# Patient Record
Sex: Female | Born: 1970 | Race: Asian | Hispanic: No | Marital: Single | Smoking: Never smoker
Health system: Southern US, Community
[De-identification: ages and names within clinical notes are randomized; demographics above are authoritative.]

## PROBLEM LIST (undated history)

## (undated) DIAGNOSIS — N368 Other specified disorders of urethra: Secondary | ICD-10-CM

## (undated) HISTORY — PX: TUBAL LIGATION: SHX77

---

## 2009-06-15 ENCOUNTER — Emergency Department (HOSPITAL_COMMUNITY): Admission: EM | Admit: 2009-06-15 | Discharge: 2009-06-15 | Payer: Self-pay | Admitting: Family Medicine

## 2010-05-06 ENCOUNTER — Inpatient Hospital Stay (INDEPENDENT_AMBULATORY_CARE_PROVIDER_SITE_OTHER)
Admission: RE | Admit: 2010-05-06 | Discharge: 2010-05-06 | Disposition: A | Discharge status: Home / Self Care | Payer: BC Managed Care – PPO | Source: Ambulatory Visit | Attending: Emergency Medicine | Admitting: Emergency Medicine

## 2010-05-06 ENCOUNTER — Ambulatory Visit (INDEPENDENT_AMBULATORY_CARE_PROVIDER_SITE_OTHER): Payer: BC Managed Care – PPO

## 2010-05-06 DIAGNOSIS — R05 Cough: Secondary | ICD-10-CM

## 2010-05-06 DIAGNOSIS — R059 Cough, unspecified: Secondary | ICD-10-CM

## 2010-12-08 ENCOUNTER — Inpatient Hospital Stay (INDEPENDENT_AMBULATORY_CARE_PROVIDER_SITE_OTHER)
Admission: RE | Admit: 2010-12-08 | Discharge: 2010-12-08 | Disposition: A | Discharge status: Home / Self Care | Payer: BC Managed Care – PPO | Source: Ambulatory Visit | Attending: Family Medicine | Admitting: Family Medicine

## 2010-12-08 DIAGNOSIS — S335XXA Sprain of ligaments of lumbar spine, initial encounter: Secondary | ICD-10-CM

## 2010-12-10 ENCOUNTER — Emergency Department (HOSPITAL_COMMUNITY)
Admission: EM | Admit: 2010-12-10 | Discharge: 2010-12-10 | Disposition: A | Discharge status: Home / Self Care | Payer: BC Managed Care – PPO | Attending: Emergency Medicine | Admitting: Emergency Medicine

## 2010-12-10 DIAGNOSIS — M545 Low back pain, unspecified: Secondary | ICD-10-CM | POA: Insufficient documentation

## 2010-12-10 DIAGNOSIS — M549 Dorsalgia, unspecified: Secondary | ICD-10-CM | POA: Insufficient documentation

## 2010-12-13 ENCOUNTER — Other Ambulatory Visit: Payer: Self-pay | Admitting: Family Medicine

## 2010-12-13 DIAGNOSIS — M545 Low back pain: Secondary | ICD-10-CM

## 2010-12-14 ENCOUNTER — Ambulatory Visit
Admission: RE | Admit: 2010-12-14 | Discharge: 2010-12-14 | Disposition: A | Discharge status: Home / Self Care | Payer: BC Managed Care – PPO | Source: Ambulatory Visit | Attending: Family Medicine | Admitting: Family Medicine

## 2010-12-14 DIAGNOSIS — M545 Low back pain: Secondary | ICD-10-CM

## 2012-06-09 ENCOUNTER — Other Ambulatory Visit: Payer: Self-pay | Admitting: Obstetrics and Gynecology

## 2012-06-09 DIAGNOSIS — Z1231 Encounter for screening mammogram for malignant neoplasm of breast: Secondary | ICD-10-CM

## 2012-06-17 ENCOUNTER — Ambulatory Visit: Payer: BC Managed Care – PPO

## 2013-02-13 ENCOUNTER — Ambulatory Visit: Payer: BC Managed Care – PPO

## 2013-02-13 ENCOUNTER — Ambulatory Visit (INDEPENDENT_AMBULATORY_CARE_PROVIDER_SITE_OTHER): Payer: BC Managed Care – PPO | Admitting: Emergency Medicine

## 2013-02-13 VITALS — BP 120/74 | HR 97 | Temp 100.4°F | Resp 18 | Ht 61.0 in | Wt 171.0 lb

## 2013-02-13 DIAGNOSIS — R05 Cough: Secondary | ICD-10-CM

## 2013-02-13 DIAGNOSIS — R059 Cough, unspecified: Secondary | ICD-10-CM

## 2013-02-13 DIAGNOSIS — J209 Acute bronchitis, unspecified: Secondary | ICD-10-CM

## 2013-02-13 MED ORDER — LEVOFLOXACIN 500 MG PO TABS: 500.0000 mg | ORAL_TABLET | Freq: Every day | ORAL | Status: AC

## 2013-02-13 MED ORDER — PROMETHAZINE-CODEINE 6.25-10 MG/5ML PO SYRP: 5.0000 mL | ORAL_SOLUTION | Freq: Four times a day (QID) | ORAL | Status: DC | PRN

## 2013-02-13 NOTE — Progress Notes (Addendum)
[facility] [address], [place] [id number] [phone]  Date:  02/13/2013   Name:  Sheri DadMaria Dawson   DOB:  [birth year]   MRN:  [id number]  PCP:  No primary provider on file.    Chief Complaint: Headache, Cough, Fever and Nasal Congestion   History of Present Illness:  Sheri Dawson is a 43 y.o. very pleasant female patient who presents with the following:  Ill since beginning of December with nasal congestion and a mucoid nasal drainage.  Has a cough for a month with now a purulent green sputum.  No wheezing or shortness of breath.  No chest pain or tightness.  Has a fever over past several days with no chills.  No nausea or vomiting.  No rash.  No improvement with over the counter medications or other home remedies. Denies other complaint or health concern today.   There are no active problems to display for this patient.   History reviewed. No pertinent past medical history.  Past Surgical History  Procedure Laterality Date  . Tubal ligation    . Cesarean section      History  Substance Use Topics  . Smoking status: Never Smoker   . Smokeless tobacco: Not on file  . Alcohol Use: Not on file    Family History  Problem Relation Age of Onset  . Cancer Maternal Grandmother     No Known Allergies  Medication list has been reviewed and updated.  No current outpatient prescriptions on file prior to visit.   No current facility-administered medications on file prior to visit.    Review of Systems:  As per HPI, otherwise negative.    Physical Examination: Filed Vitals:   02/13/13 1830  BP: 120/74  Pulse: 97  Temp: 100.4 F (38 C)  Resp: 18   Filed Vitals:   02/13/13 1830  Height: 5\' 1"  (1.549 m)  Weight: 171 lb (77.565 kg)   Body mass index is 32.33 kg/(m^2). Ideal Body Weight: Weight in (lb) to have BMI = 25: 132  GEN: WDWN, NAD, Non-toxic, A & O x 3 HEENT: Atraumatic, Normocephalic. Neck supple. No masses, No LAD. Ears and  Nose: No external deformity. CV: RRR, No M/G/R. No JVD. No thrill. No extra heart sounds. PULM: CTA B, no wheezes, crackles, rhonchi. No retractions. No resp. distress. No accessory muscle use. ABD: S, NT, ND, +BS. No rebound. No HSM. EXTR: No c/c/e NEURO Normal gait.  PSYCH: Normally interactive. Conversant. Not depressed or anxious appearing.  Calm demeanor.    Assessment and Plan: Bronchitis levaquin Phen c cod  Signed,  Phillips OdorJeffery Corena Tilson, MD   [facility] reading (PRIMARY) by  Dr. Dareen PianoAnderson.  negative.

## 2013-02-13 NOTE — Patient Instructions (Signed)

## 2013-06-01 ENCOUNTER — Other Ambulatory Visit: Payer: Self-pay

## 2013-06-01 ENCOUNTER — Ambulatory Visit
Admission: RE | Admit: 2013-06-01 | Discharge: 2013-06-01 | Disposition: A | Discharge status: Home / Self Care | Payer: BC Managed Care – PPO | Source: Ambulatory Visit

## 2013-06-01 DIAGNOSIS — Z1231 Encounter for screening mammogram for malignant neoplasm of breast: Secondary | ICD-10-CM

## 2013-06-03 ENCOUNTER — Other Ambulatory Visit: Payer: Self-pay | Admitting: Obstetrics and Gynecology

## 2013-06-03 DIAGNOSIS — R928 Other abnormal and inconclusive findings on diagnostic imaging of breast: Secondary | ICD-10-CM

## 2013-06-12 ENCOUNTER — Ambulatory Visit
Admission: RE | Admit: 2013-06-12 | Discharge: 2013-06-12 | Disposition: A | Discharge status: Home / Self Care | Payer: BC Managed Care – PPO | Source: Ambulatory Visit | Attending: Obstetrics and Gynecology | Admitting: Obstetrics and Gynecology

## 2013-06-12 DIAGNOSIS — R928 Other abnormal and inconclusive findings on diagnostic imaging of breast: Secondary | ICD-10-CM

## 2014-08-03 IMAGING — CR DG CHEST 2V
2 series · 2 of 2 positions shown · non-contrast
Comparison: 05/06/2010

CLINICAL DATA: Cough

EXAM:
CHEST  2 VIEW

[PA]
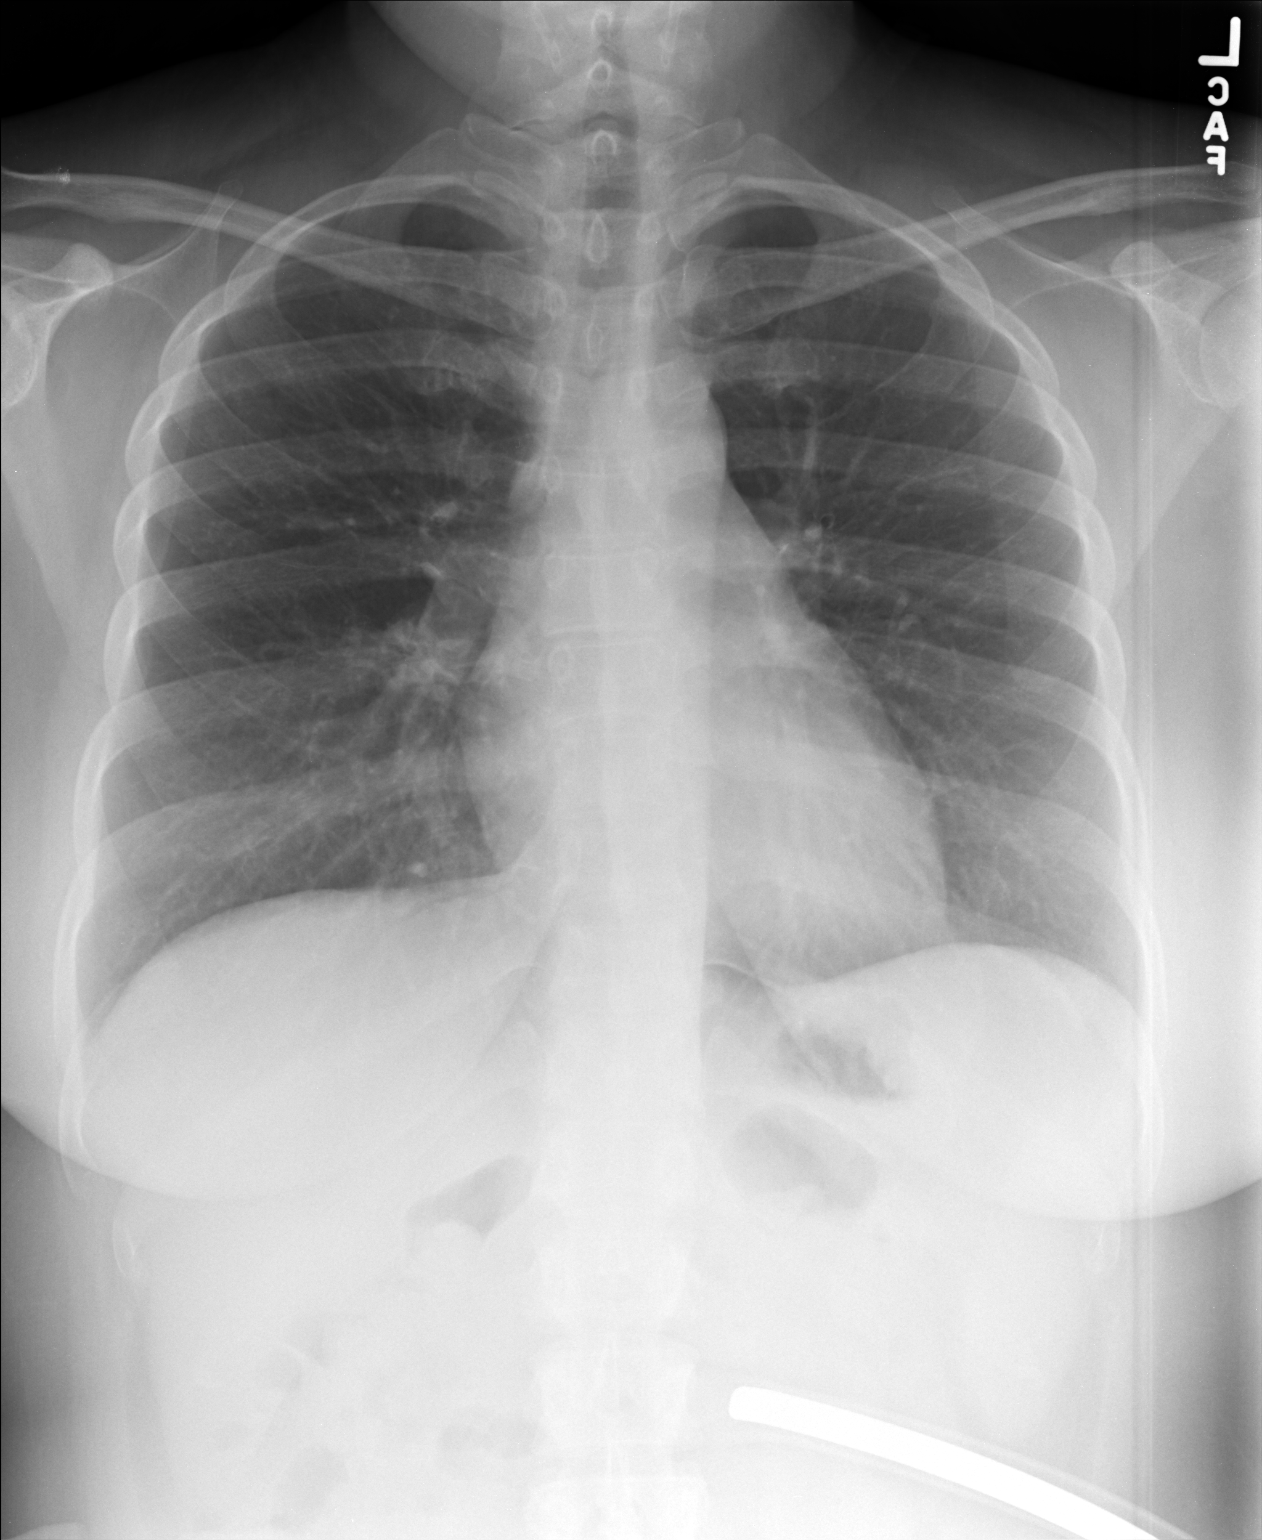

[lateral]
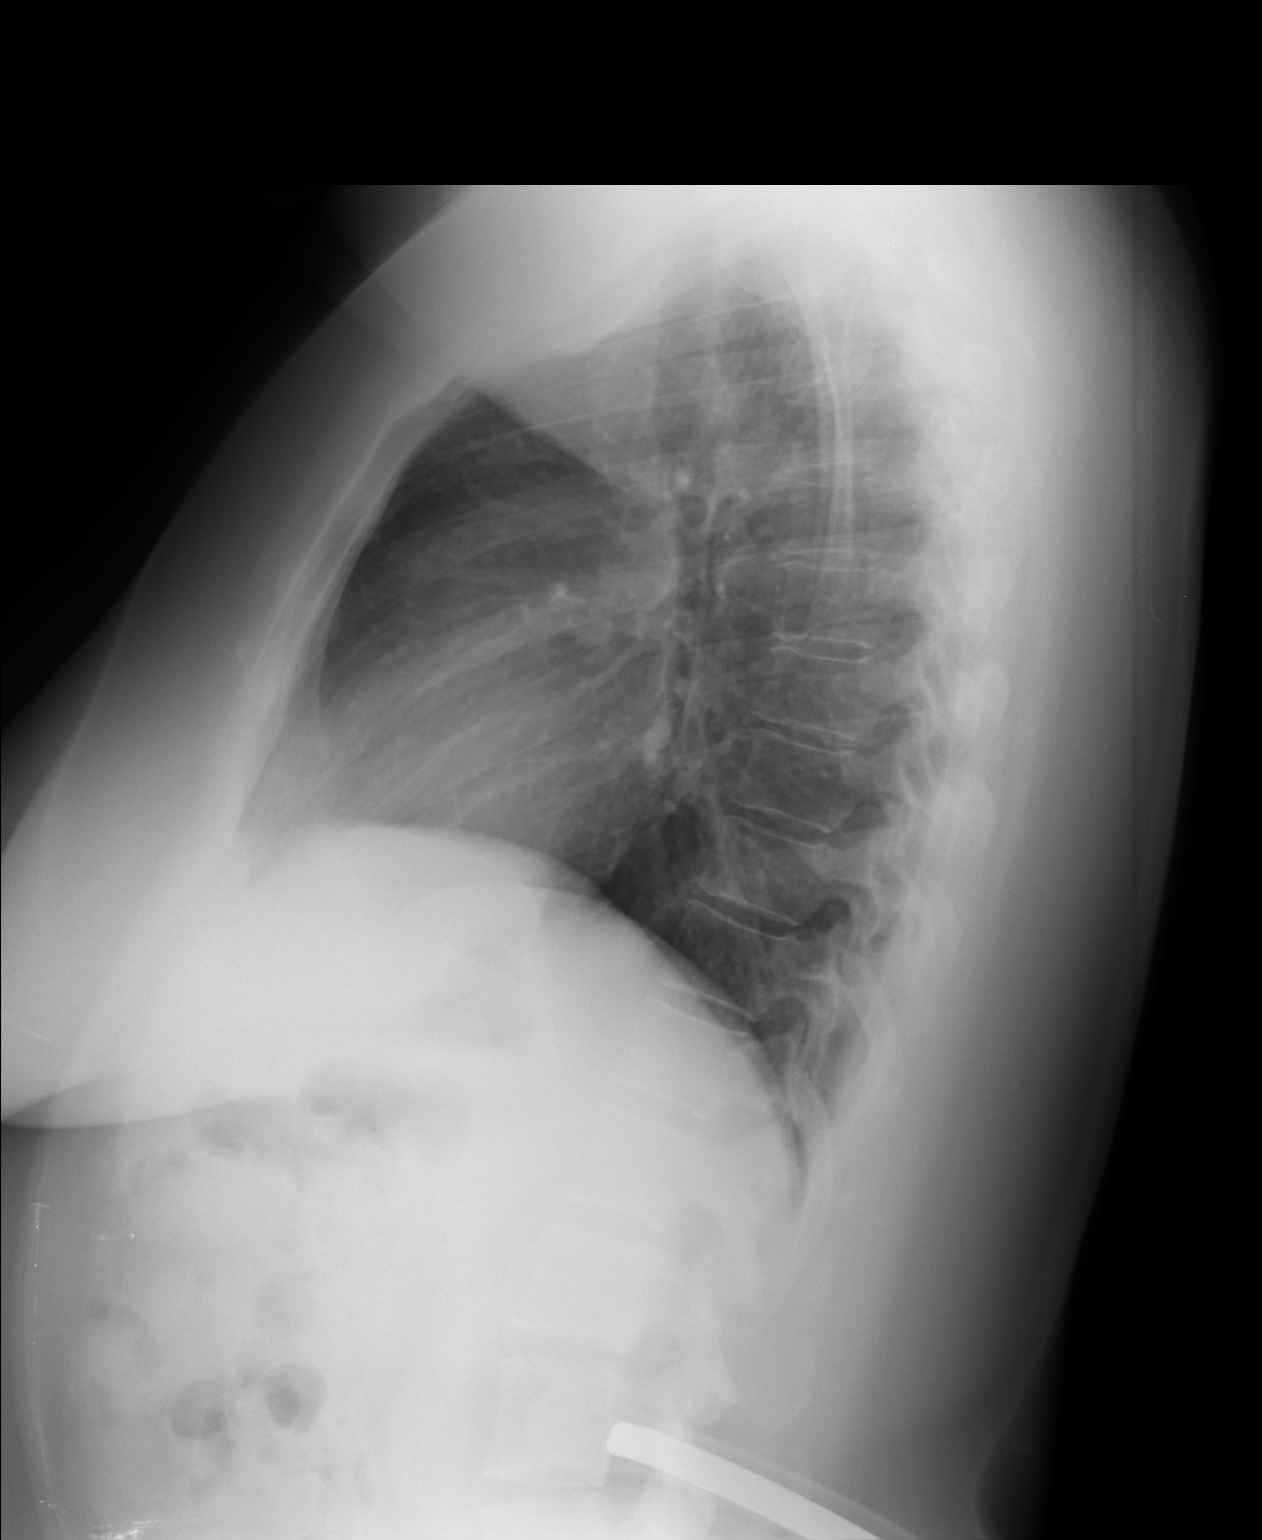

[2 of 2 positions shown; findings below may reference images not displayed]

FINDINGS: The heart size and mediastinal contours are within normal limits.
Both lungs are clear. The visualized skeletal structures are
unremarkable.
IMPRESSION: No active cardiopulmonary disease.

## 2014-11-30 IMAGING — MG MM DIGITAL DIAGNOSTIC UNILAT*R*
2 series · 2 of 2 positions shown · non-contrast
Comparison: Prior baseline mammogram 06/01/2013

CLINICAL DATA: Screening callback for questioned right breast mass

EXAM:
DIGITAL DIAGNOSTIC  right MAMMOGRAM

[R CC]
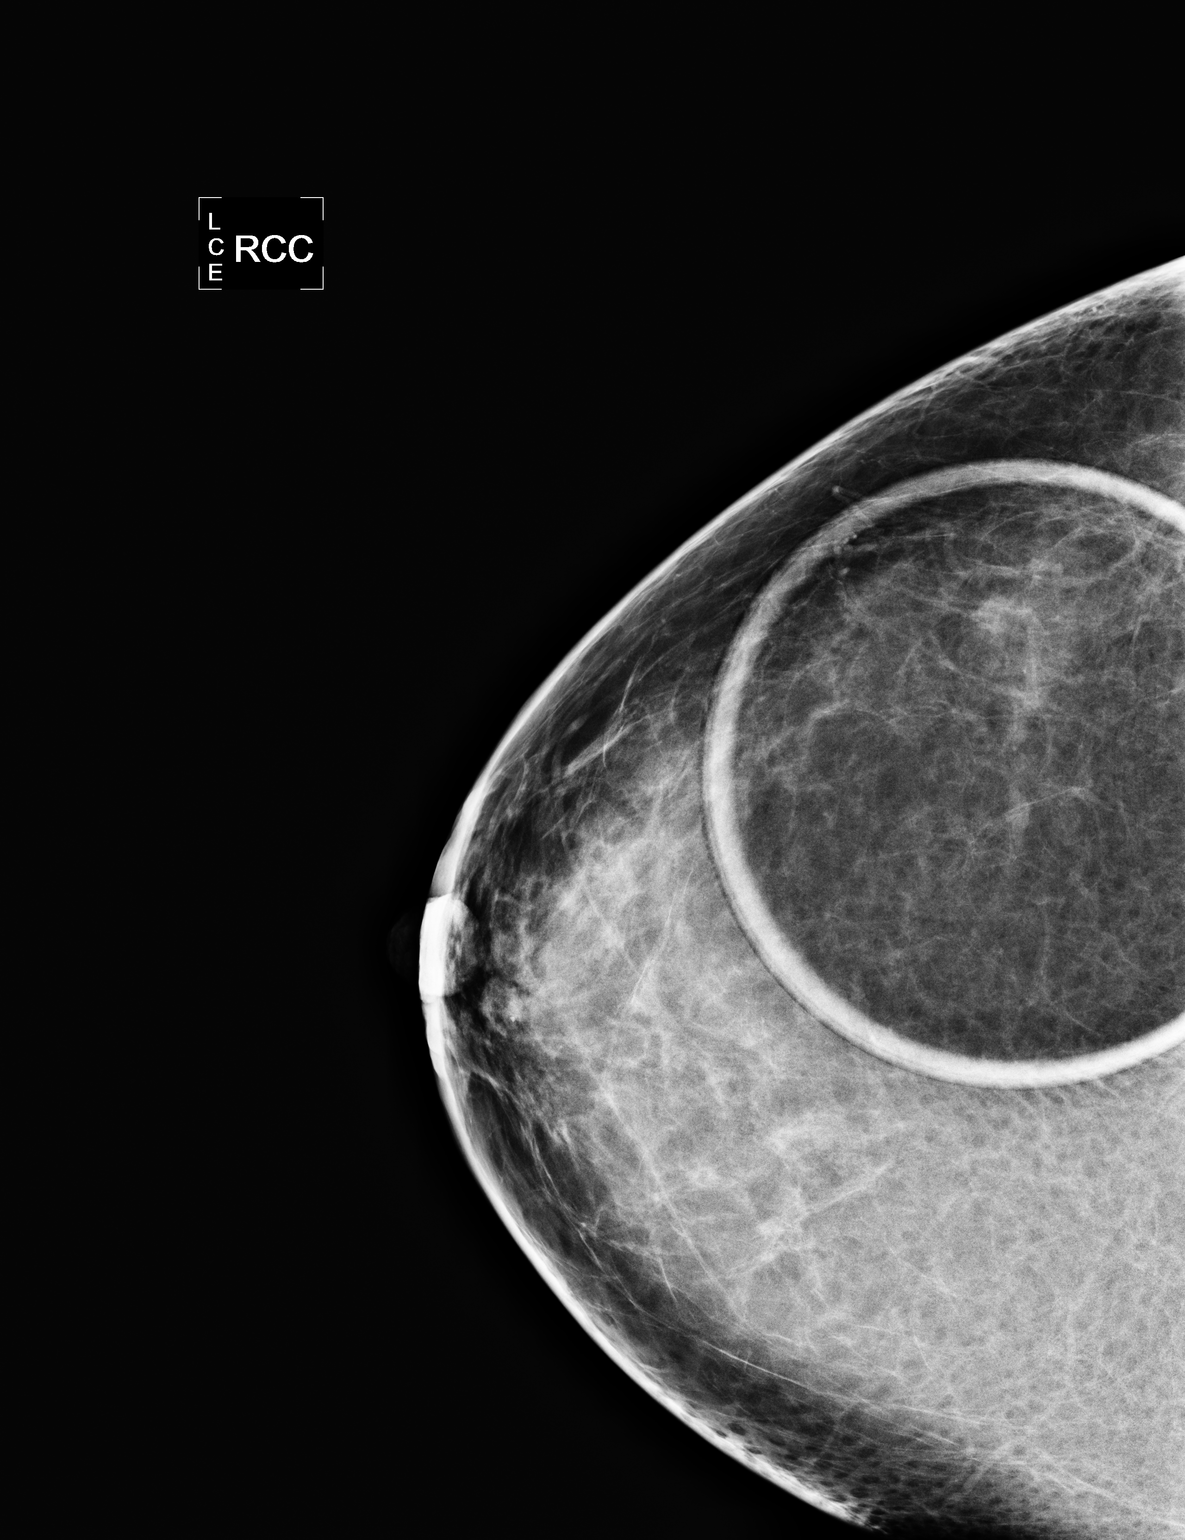

[R MLO]
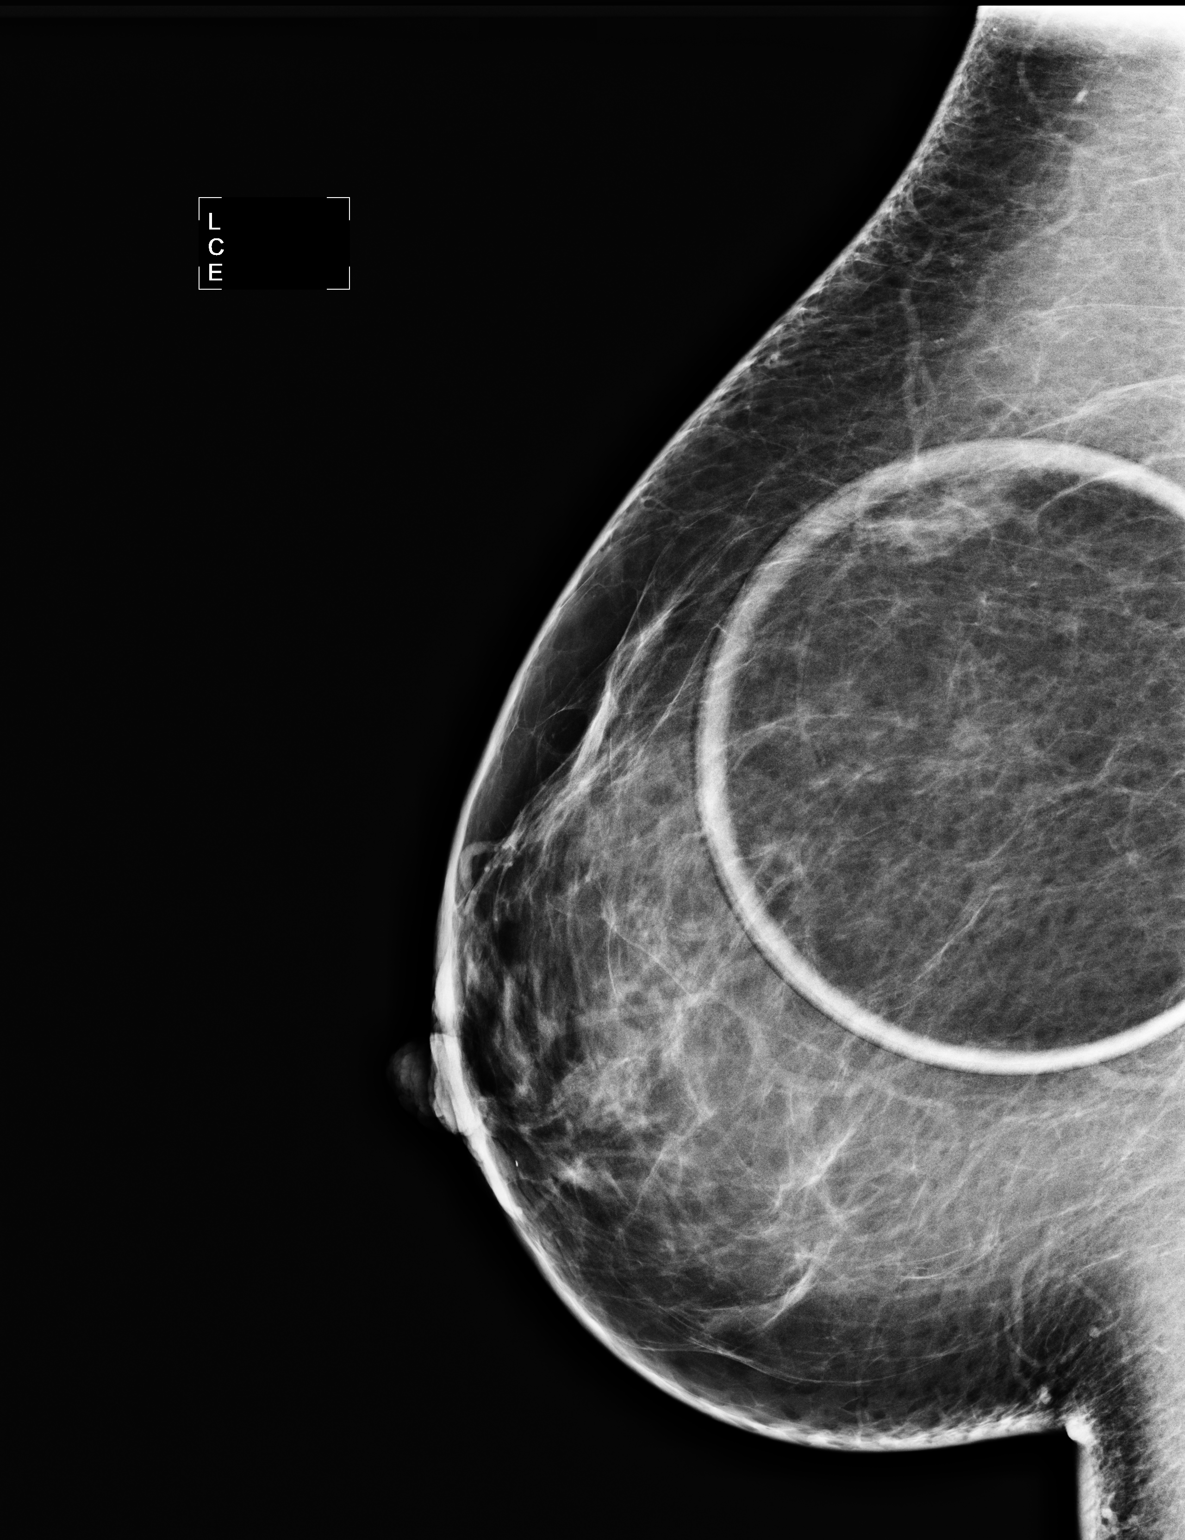

[2 of 2 positions shown; findings below may reference images not displayed]

ACR Breast Density Category b: There are scattered areas of
fibroglandular density.
FINDINGS: Additional views confirm the presence of a normal-appearing
intramammary lymph node in the right upper outer quadrant with a
fatty hilum and benign appearing morphology. This corresponds to the
screening mammographic finding.
IMPRESSION: No evidence for malignancy in the right breast. Benign intramammary
lymph node is identified at the site of the questioned abnormality.

RECOMMENDATION:
Screening mammogram in one year.(Code:[id number])

I have discussed the findings and recommendations with the patient.
Results were also provided in writing at the conclusion of the
visit. If applicable, a reminder letter will be sent to the patient
regarding the next appointment.

BI-RADS CATEGORY  1: Negative.

## 2019-06-08 ENCOUNTER — Ambulatory Visit: Admit: 2019-06-08 | Discharge: 2019-06-08 | Attending: Urology

## 2019-06-08 ENCOUNTER — Ambulatory Visit: Attending: Urology

## 2019-06-08 DIAGNOSIS — N368 Other specified disorders of urethra: Secondary | ICD-10-CM

## 2019-06-08 LAB — AMB POC URINALYSIS DIP STICK AUTO W/O MICRO
Bilirubin (UA POC): NEGATIVE
Bilirubin, Urine, POC: NEGATIVE
Glucose (UA POC): NEGATIVE
Glucose, Urine, POC: NEGATIVE
Ketones (UA POC): NEGATIVE
Ketones, Urine, POC: NEGATIVE
Leukocyte Esterase, Urine, POC: NEGATIVE
Leukocyte esterase (UA POC): NEGATIVE
Nitrite, Urine, POC: NEGATIVE
Nitrites (UA POC): NEGATIVE
Protein (UA POC): NEGATIVE
Protein, Urine, POC: NEGATIVE
Specific Gravity, Urine, POC: 1.005 NA (ref 1.001–1.035)
Specific gravity (UA POC): 1.005 (ref 1.001–1.035)
Urobilinogen (UA POC): 0.2 (ref 0.2–1)
Urobilinogen, POC: 0.2 (ref 0.2–1)
pH (UA POC): 6.5 (ref 4.6–8.0)
pH, Urine, POC: 6.5 NA (ref 4.6–8.0)

## 2019-06-08 NOTE — Progress Notes (Signed)
Appointment Information   Name: Birkey, Ellen [name]   Date: 06/18/2019 Status: Sch   Time: 12:00 PM Length: 45   Visit Type: MR PELVIS [[id number]

## 2019-06-08 NOTE — Progress Notes (Signed)
Ellen [name] has order for MRI Pelvis      To be done at [facility]    Needed by:  Jul 05, 2019    Patient has a follow-up appointment:  Yes  Jul 06, 2019    If MRI, does patient have a pacemaker:   No    Order has been placed in connect care:  Yes    Is this a STAT order:  No      Ronda Chavis

## 2019-06-08 NOTE — Progress Notes (Signed)
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 Urinary incontinence, mixed  N39.46 788.33 AMB POC URINALYSIS DIP STICK AUTO W/O MICRO                  PLAN:    1. Suburethral cyst    On exam today 06/08/19: 1 cm suburethral cyst    Recommend MRI Pelv to confirm that this is a suburethral cyst and not a diverticulum.    Can surgically remove cyst- will further discuss after MRI      2. Urinary frequency, Mixed urinary incontinence    We discussed the evaluation and management of urinary incontinence in detail. She was given a handout of foods that are bladder irritants that may increase urgency. In addition we discussed oral medications that are available for the treatment of overactive bladder. We discussed anticholinergics and beta agonist therapy. In addition, we discussed the side effects of each of the medications. If conservative therapies do not provide adequate relief, we discussed options for further intervention including Interstim and Botox therapies.    Pt would like to start with behavioral modification- limit bladder irritants       Body mass index is 32.69 kg/m??.     RTC to review MRI Pelv result          DISCUSSION:      Chief Complaint   Patient presents with   ??? Cyst     Pt in for suburethral cyst. Pt referred by Ellen [name] who is pt's gyn. She thinks this is why pt has ovb. She states that her cervix is reveresed and mat be leaning on her bladder. Pt states that this has been going on for yrs. Pt states that she has "a mass of meat" and that is what started this whole thing.        HISTORY OF PRESENT ILLNESS:  Ellen [name] is a 49 y.o. female who presents today for suburethral cyst as referred by Dr. Alvis Lemmings Huggins-Jones.   No pain from urethral cyst, notes this has been present for years. No change in size. Some irritation during sexual intercourse.    Pt reports urinary incontinence,  she is wearing pads daily. She reports severe urinary frequency during the day. Notes severe urgency within a few minutes after fluid intake. Notes Sx started a several years ago. No nocturia. No f/c/n/v.        REVIEW OF SYSTEMS:     Constitutional: Fever: No  Skin: Rash: No  HEENT: Hearing difficulty: No  Eyes: Blurred vision: No  Cardiovascular: Chest pain: No  Respiratory: Shortness of breath: No  Gastrointestinal: Nausea/vomiting: No  Musculoskeletal: Back pain: No  Neurological: Weakness: No  Psychological: Memory loss: No  Comments/additional findings:     History reviewed. No pertinent past medical history.  History reviewed. No pertinent surgical history.  History reviewed. No pertinent family history.  Social History     Tobacco Use   ??? Smoking status: Never Smoker   ??? Smokeless tobacco: Never Used   Substance Use Topics   ??? Alcohol use: Yes   ??? Drug use: Never     Allergies   Allergen Reactions   ??? Latex Rash       OB History    No obstetric history on file.  Any elements of the PMH, FH, SHx, ROS, or preliminary elements of the HPI that were entered by a medical assistant have been reviewed in full.     PHYSICAL EXAMINATION:     Visit Vitals  Resp 14   Ht 5\' 1"  (1.549 m)   Wt 173 lb (78.5 kg)   LMP 05/18/2019   BMI 32.69 kg/m??     Constitutional: Well developed, well-nourished female in no acute distress.   CV:  No peripheral swelling noted  Respiratory: No respiratory distress or difficulties  Abdomen:  Soft and nontender. No masses.    GU Female:     Vagina: nl   Urethra: 1 cm suburethral cyst    Cervix and uterus present   Rectocele:  none    Urethral hypermobility is absent   Negative leak with cough/strain.    Skin: No bruising or rashes.  No petechia.    Neuro/Psych:  Patient with appropriate affect.  Alert and oriented.    Lymphatic:   No enlargement of inguinal lymph nodes.    REVIEW OF LABS AND IMAGING:      Results for orders placed or performed in visit on 06/08/19   AMB POC URINALYSIS  DIP STICK AUTO W/O MICRO   Result Value Ref Range    Color (UA POC) Yellow     Clarity (UA POC) Clear     Glucose (UA POC) Negative Negative    Bilirubin (UA POC) Negative Negative    Ketones (UA POC) Negative Negative    Specific gravity (UA POC) 1.005 1.001 - 1.035    Blood (UA POC) Trace Negative    pH (UA POC) 6.5 4.6 - 8.0    Protein (UA POC) Negative Negative    Urobilinogen (UA POC) 0.2 mg/dL 0.2 - 1    Nitrites (UA POC) Negative Negative    Leukocyte esterase (UA POC) Negative Negative       Prior labs or imaging:  none      A copy of today's office visit with all pertinent imaging results and labs were sent to the referring physician.      Brantley Fling, MD    Michaell Cowing, M.D.,   [facility]  a division of [facility] [place]    [place], [place]  [phone] ext [phone]  [phone] fax    Documentation provided with the assistance of Fortino Sic, medical scribe for Brantley Fling, MD

## 2019-06-08 NOTE — Progress Notes (Signed)
Faxed imaging to [facility]; [facility] [phone]; mri pelv; f/u 07/06/19

## 2019-06-08 NOTE — Progress Notes (Signed)
Appointment Information   Name: Ellen, [name] MRN: [id number]   Date: 06/18/2019 Status: Sch   Time: 12:00 PM Length: 45   Visit Type: MR PELVIS [[id number]

## 2019-06-08 NOTE — Progress Notes (Signed)
Progress Notes by Brantley Fling, MD at 06/08/19 1500                Author: Brantley Fling, MD  Service: --  Author Type: Physician       Filed: 06/15/19 1436  Encounter Date: 06/08/2019  Status: Signed          Editor: Brantley Fling, MD (Physician)                                 ASSESSMENT:              ICD-10-CM  ICD-9-CM             1.  Suburethral cyst   N36.8  599.89  MRI PELV W WO CONT                AMB POC URINALYSIS DIP STICK AUTO W/O MICRO           2.  Urinary incontinence, mixed   N39.46  788.33  AMB POC URINALYSIS DIP STICK AUTO W/O MICRO                           PLAN:     1. Suburethral cyst     On exam today 06/08/19: 1 cm suburethral cyst     Recommend MRI Pelv to confirm that this is a suburethral cyst and not a diverticulum.     Can surgically remove cyst- will further discuss after MRI        2. Urinary frequency, Mixed urinary incontinence     We discussed the evaluation and management of urinary incontinence in detail. She was given a handout of foods that are bladder irritants that may increase urgency. In addition we discussed oral medications that are available for the treatment of  overactive bladder. We discussed anticholinergics and beta agonist therapy. In addition, we discussed the side effects of each of the medications. If conservative therapies do not provide adequate relief, we discussed options for further intervention  including Interstim and Botox therapies.     Pt would like to start with behavioral modification- limit bladder irritants          Body mass index is 32.69 kg/m??.       RTC to review MRI Pelv result               DISCUSSION:          Chief Complaint       Patient presents with        ?  Cyst             Pt in for suburethral cyst. Pt referred by Sherryl Barters who is pt's gyn. She thinks this is why pt has ovb. She states that her cervix is reveresed  and mat be leaning on her bladder. Pt states that this has been going on  for yrs. Pt states that she has "a mass of meat" and that is what started this whole thing.            HISTORY OF PRESENT ILLNESS:  Ellen [name] is a  49 y.o. female who presents today for suburethral cyst as referred by Dr. Arrie Aran Huggins-Jones.    No pain from urethral cyst, notes this has been present for years. No change in size. Some irritation during sexual intercourse.  Pt reports urinary incontinence, she is wearing pads daily. She reports severe urinary frequency during the day. Notes severe urgency within a few minutes after fluid intake. Notes Sx started a several years ago. No nocturia. No f/c/n/v.           REVIEW OF SYSTEMS:       Constitutional: Fever: No   Skin: Rash: No   HEENT: Hearing difficulty: No   Eyes: Blurred vision: No   Cardiovascular: Chest pain: No   Respiratory: Shortness of breath: No   Gastrointestinal: Nausea/vomiting: No   Musculoskeletal: Back pain: No   Neurological: Weakness: No   Psychological: Memory loss: No   Comments/additional findings:       History reviewed. No pertinent past medical history.   History reviewed. No pertinent surgical history.   History reviewed. No pertinent family history.     Social History          Tobacco Use         ?  Smoking status:  Never Smoker     ?  Smokeless tobacco:  Never Used       Substance Use Topics         ?  Alcohol use:  Yes         ?  Drug use:  Never          Allergies        Allergen  Reactions         ?  Latex  Rash             OB History      No obstetric history on file.                Any elements of the PMH, FH, SHx, ROS, or preliminary elements of the HPI that were entered by a medical assistant have been reviewed in full.        PHYSICAL EXAMINATION:       Visit Vitals      Resp  14     Ht  5\' 1"  (1.549 m)     Wt  173 lb (78.5 kg)     LMP  05/18/2019        BMI  32.69 kg/m??        Constitutional: Well developed, well-nourished  female in no acute distress.    CV:  No peripheral swelling noted   Respiratory: No respiratory  distress or difficulties   Abdomen:  Soft and nontender. No masses.     GU Female:      Vagina: nl    Urethra: 1 cm suburethral cyst     Cervix and uterus present    Rectocele:  none     Urethral hypermobility is absent    Negative leak with cough/strain.      Skin: No bruising or rashes.  No petechia.     Neuro/Psych:  Patient with appropriate affect.  Alert and oriented.     Lymphatic:   No enlargement of inguinal lymph nodes.      REVIEW OF LABS AND IMAGING:          Results for orders placed or performed in visit on 06/08/19     AMB POC URINALYSIS DIP STICK AUTO W/O MICRO         Result  Value  Ref Range            Color (UA POC)  Yellow         Clarity (  UA POC)  Clear         Glucose (UA POC)  Negative  Negative       Bilirubin (UA POC)  Negative  Negative       Ketones (UA POC)  Negative  Negative       Specific gravity (UA POC)  1.005  1.001 - 1.035       Blood (UA POC)  Trace  Negative       pH (UA POC)  6.5  4.6 - 8.0       Protein (UA POC)  Negative  Negative       Urobilinogen (UA POC)  0.2 mg/dL  0.2 - 1       Nitrites (UA POC)  Negative  Negative            Leukocyte esterase (UA POC)  Negative  Negative           Prior labs or imaging:   none         A copy of today's office visit with all pertinent imaging results and labs were sent to the referring physician.        Treasa School, MD      Freda Munro, M.D.,   FPM-RS   [facility]   a division of [facility] [place]      [address]   [place], [place]   [address]   [phone] ext [phone]   [phone] fax      Documentation provided with the assistance of Emi Belfast, medical scribe for Treasa School, MD

## 2019-06-08 NOTE — Progress Notes (Signed)
Faxed imaging to [facility]; [facility] [phone]; mri pelv; f/u 07/06/19

## 2019-06-08 NOTE — Progress Notes (Signed)
Ellen [name] has order for MRI Pelvis      To be done at [facility]    Needed by:  Jul 05, 2019    Patient has a follow-up appointment:  Yes  Jul 06, 2019    If MRI, does patient have a pacemaker:   No    Order has been placed in connect care:  Yes    Is this a STAT order:  No      Serita Grammes

## 2019-06-22 ENCOUNTER — Encounter

## 2019-07-06 ENCOUNTER — Ambulatory Visit: Admit: 2019-07-06 | Discharge: 2019-07-06 | Attending: Urology

## 2019-07-06 ENCOUNTER — Ambulatory Visit: Attending: Urology

## 2019-07-06 DIAGNOSIS — Q524 Other congenital malformations of vagina: Secondary | ICD-10-CM

## 2019-07-06 LAB — AMB POC URINALYSIS DIP STICK AUTO W/O MICRO
Bilirubin (UA POC): NEGATIVE
Bilirubin, Urine, POC: NEGATIVE
Blood (UA POC): NEGATIVE
Blood (UA POC): NEGATIVE
Glucose (UA POC): NEGATIVE
Glucose, Urine, POC: NEGATIVE
Ketones (UA POC): NEGATIVE
Ketones, Urine, POC: NEGATIVE
Leukocyte Esterase, Urine, POC: NEGATIVE
Leukocyte esterase (UA POC): NEGATIVE
Nitrite, Urine, POC: NEGATIVE
Nitrites (UA POC): NEGATIVE
Protein (UA POC): NEGATIVE
Protein, Urine, POC: NEGATIVE
Specific Gravity, Urine, POC: 1.015 NA (ref 1.001–1.035)
Specific gravity (UA POC): 1.015 (ref 1.001–1.035)
Urobilinogen (UA POC): 0.2 (ref 0.2–1)
Urobilinogen, POC: 0.2 (ref 0.2–1)
pH (UA POC): 6.5 (ref 4.6–8.0)
pH, Urine, POC: 6.5 NA (ref 4.6–8.0)

## 2019-07-06 NOTE — Progress Notes (Signed)
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 Urinary frequency  R35.0 788.41 AMB POC URINALYSIS DIP STICK AUTO W/O MICRO           1. 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 Reviewed MRI results today. Copy of report provided.    The cyst is bothersome and she is interested in surgical removed. Discussed surgical removal of cyst. She is relocating next week outside of [facility]. Recommend she establish care with Urology (Recommend Dr Gardiner Sleeper in [place]).       2. Urinary frequency, Mixed urinary incontinence    She elected to do behavioral modifications. Continue to limit bladder irritants.       Body mass index is 32.69 kg/m??.     RTC PRN given her upcoming move.         DISCUSSION:      Chief Complaint   Patient presents with   ??? Cyst     Pt being seen to review imaging.   ??? Urinary Incontinence       HISTORY OF PRESENT ILLNESS:  Ellen Young is a 49 y.o. female who presents today in follow up for suburethral cyst. 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 She denies changes in symptoms. The cyst causes some irritation during intercourse. It is 8/10 on scale of bothersome. She has stable incontinence. No gross hematuria, dysuria. Asymptotic for infection. No f/c/n/v.  She is moving to [place] next week [facility] outside of [place]).    Prior history:  Consultation appt 06/08/19: No pain from urethral cyst, notes this has been present for years. No change in size. Some irritation during sexual intercourse.    Pt reports urinary incontinence, she is wearing pads daily. She reports severe urinary frequency during the day. Notes severe urgency within a few minutes after fluid intake. Notes Sx started a  several years ago. No nocturia. No f/c/n/v.        REVIEW OF SYSTEMS:     Constitutional: Fever: No  Skin: Rash: No  HEENT: Hearing difficulty: No  Eyes: Blurred vision: No  Cardiovascular: Chest pain: No  Respiratory: Shortness of breath: No  Gastrointestinal: Nausea/vomiting: No  Musculoskeletal: Back pain: No  Neurological: Weakness: No  Psychological: Memory loss: No  Comments/additional findings:     History reviewed. No pertinent past medical history.  History reviewed. No pertinent surgical history.  History reviewed. No pertinent family history.  Social History     Tobacco Use   ??? Smoking status: Never Smoker   ??? Smokeless tobacco: Never Used   Substance Use Topics   ??? Alcohol use: Yes   ??? Drug use: Never     Allergies   Allergen Reactions   ??? Latex Rash       OB History    No obstetric history on file.         Any elements of the PMH, FH, SHx, ROS, or preliminary elements of the HPI that were entered by a medical assistant have been reviewed in full.     PHYSICAL EXAMINATION:     Visit Vitals  Resp 18   Ht 5\' 1"  (1.549 m)   Wt  173 lb (78.5 kg)   BMI 32.69 kg/m??     Constitutional: Well developed, well-nourished female in no acute distress.   CV:  No peripheral swelling noted  Respiratory: No respiratory distress or difficulties  Abdomen:  Soft and nontender. No masses.    GU Female:  06/08/19   Vagina: nl   Urethra: 1 cm suburethral cyst    Cervix and uterus present   Rectocele:  none    Urethral hypermobility is absent   Negative leak with cough/strain.    Skin: No bruising or rashes.  No petechia.    Neuro/Psych:  Patient with appropriate affect.  Alert and oriented.    Lymphatic:   No enlargement of inguinal lymph nodes.    REVIEW OF LABS AND IMAGING:      Results for orders placed or performed in visit on 07/06/19   AMB POC URINALYSIS DIP STICK AUTO W/O MICRO   Result Value Ref Range    Color (UA POC) Yellow     Clarity (UA POC) Clear     Glucose (UA POC) Negative Negative    Bilirubin (UA POC) Negative  Negative    Ketones (UA POC) Negative Negative    Specific gravity (UA POC) 1.015 1.001 - 1.035    Blood (UA POC) Negative Negative    pH (UA POC) 6.5 4.6 - 8.0    Protein (UA POC) Negative Negative    Urobilinogen (UA POC) 0.2 mg/dL 0.2 - 1    Nitrites (UA POC) Negative Negative    Leukocyte esterase (UA POC) Negative Negative       Prior labs or imaging:  MRI pelv 06/18/19  FINDINGS:   ??   URETHRA:   Normal orientation and position. No diverticulum identified. Cystic lesion is present between the vagina and the urethra, posterior to the ureteral wall which measures up to 1.7 cm, which has the appearance of a Gartner Duct cyst. This cyst is T1 hyperintense, which is suggestive of proteinaceous material.   ??   UTERUS:   Anteverted. Normal size. ??Endometrium unremarkable. No significant junctional zone thickening. Multiple nabothian cysts are present.   ??   ADNEXA:   No suspicious abnormality of visualized portions.   ??   OTHER:   Urinary bladder appears normal. The pelvic floor musculature is unremarkable, no pelvic floor descent.   ??     ??   IMPRESSION   ??   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           A copy of today's office visit with all pertinent imaging results and labs were sent to the referring physician.      Brantley Fling, MD    Michaell Cowing, M.D.,   [place]  a division of [facility] [place]    [place], [place]  [phone] ext [phone]  [phone] fax    Medical documentation is provided with the assistance of Mickel Fuchs, medical scribe for Brantley Fling, MD on 07/06/2019

## 2019-07-06 NOTE — Progress Notes (Signed)
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 Reviewed MRI results today. Copy of report provided.     The cyst is bothersome and she is interested in surgical removed. Discussed surgical removal of cyst. She is relocating next week outside of [facility]. Recommend she establish care with Urology (Recommend Dr Gardiner Sleeper in [place]).          2. Urinary frequency, Mixed urinary incontinence     She elected to do behavioral modifications. Continue to limit bladder irritants.          Body mass index is 32.69 kg/m??.       RTC PRN given her upcoming move.              DISCUSSION:          Chief Complaint       Patient presents with        ?  Cyst             Pt being seen to review imaging.        ?  Urinary Incontinence           HISTORY OF PRESENT ILLNESS:  Ellen Young is a  49 y.o. female who presents today in follow up for suburethral cyst. 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 She denies changes in symptoms. The cyst causes some irritation during intercourse.  It is 8/10 on scale of bothersome. She has stable incontinence. No gross hematuria, dysuria. Asymptotic for infection. No f/c/n/v.   She is moving to [place] next week [facility] outside of [place]).      Prior history:   Consultation appt 06/08/19: No pain from urethral cyst, notes this has been present for years. No change in size. Some irritation during sexual  intercourse.     Pt reports urinary incontinence, she is wearing pads daily. She reports severe urinary frequency during the day. Notes severe urgency within a few minutes after fluid intake. Notes Sx started a several years ago. No nocturia. No f/c/n/v.           REVIEW OF SYSTEMS:  Constitutional: Fever: No   Skin: Rash: No   HEENT: Hearing difficulty: No   Eyes: Blurred vision: No   Cardiovascular: Chest pain: No   Respiratory: Shortness of breath: No   Gastrointestinal: Nausea/vomiting: No   Musculoskeletal: Back pain: No   Neurological: Weakness: No   Psychological: Memory loss: No   Comments/additional findings:       History reviewed. No pertinent past medical history.   History reviewed. No pertinent surgical history.   History reviewed. No pertinent family history.     Social History          Tobacco Use         ?  Smoking status:  Never Smoker     ?  Smokeless tobacco:  Never Used       Substance Use Topics         ?  Alcohol use:  Yes         ?  Drug use:  Never          Allergies        Allergen  Reactions         ?  Latex  Rash             OB History      No obstetric history on file.                Any elements of the PMH, FH, SHx, ROS, or preliminary elements of the HPI that were entered by a medical assistant have been reviewed in full.        PHYSICAL EXAMINATION:       Visit Vitals      Resp  18     Ht  5\' 1"  (1.549 m)     Wt  173 lb (78.5 kg)        BMI  32.69 kg/m??        Constitutional: Well developed, well-nourished  female in no acute distress.    CV:  No peripheral swelling noted   Respiratory: No respiratory distress or difficulties   Abdomen:  Soft and nontender. No masses.     GU Female:  06/08/19    Vagina: nl    Urethra: 1 cm suburethral cyst     Cervix and uterus present    Rectocele:  none      Urethral hypermobility is absent    Negative leak with cough/strain.      Skin: No bruising or rashes.  No petechia.     Neuro/Psych:  Patient with appropriate affect.  Alert and oriented.     Lymphatic:   No enlargement of inguinal lymph nodes.      REVIEW OF LABS AND IMAGING:          Results for orders placed or performed in visit on 07/06/19     AMB POC URINALYSIS DIP STICK AUTO W/O MICRO         Result  Value  Ref Range            Color (UA POC)  Yellow         Clarity (UA POC)  Clear         Glucose (UA POC)  Negative  Negative       Bilirubin (UA POC)  Negative  Negative       Ketones (UA POC)  Negative  Negative       Specific gravity (UA POC)  1.015  1.001 - 1.035  Blood (UA POC)  Negative  Negative       pH (UA POC)  6.5  4.6 - 8.0       Protein (UA POC)  Negative  Negative       Urobilinogen (UA POC)  0.2 mg/dL  0.2 - 1       Nitrites (UA POC)  Negative  Negative            Leukocyte esterase (UA POC)  Negative  Negative           Prior labs or imaging:   MRI pelv 06/18/19   FINDINGS:   ??   URETHRA:   Normal orientation and position. No diverticulum identified. Cystic lesion is present between the vagina and the urethra, posterior to the ureteral wall which  measures up to 1.7 cm, which has the appearance of a Gartner Duct cyst. This cyst is T1 hyperintense, which is suggestive of proteinaceous material.   ??   UTERUS:   Anteverted. Normal size. ??Endometrium unremarkable. No significant junctional  zone thickening. Multiple nabothian cysts are present.   ??   ADNEXA:   No suspicious abnormality of visualized portions.   ??   OTHER:   Urinary bladder appears normal. The pelvic floor musculature is unremarkable, no pelvic  floor descent.   ??     ??   IMPRESSION   ??   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                A copy of today's office visit with all pertinent imaging results and labs were  sent to the referring physician.        Treasa School, MD      Freda Munro, M.D.,   FPM-RS   [facility]   a division of [facility] [place]      [address]   [place], [place]   [address]   [phone] ext [phone]   [phone] fax      Medical documentation is provided with the assistance of Fletcher Anon, medical scribe for Treasa School, MD on  07/06/2019

## 2024-02-09 ENCOUNTER — Ambulatory Visit: Admission: EM | Admit: 2024-02-09 | Discharge: 2024-02-09 | Disposition: A | Discharge status: Home / Self Care

## 2024-02-09 DIAGNOSIS — J069 Acute upper respiratory infection, unspecified: Secondary | ICD-10-CM | POA: Diagnosis not present

## 2024-02-09 MED ORDER — PSEUDOEPH-BROMPHEN-DM 30-2-10 MG/5ML PO SYRP: 5.0000 mL | ORAL_SOLUTION | Freq: Four times a day (QID) | ORAL | 0 refills | Status: AC | PRN

## 2024-02-09 NOTE — ED Triage Notes (Signed)
 Patient to Urgent Care with complaints of cough w/ green mucus/ headache/ nasal congestion/ chest congestion/ wheezing. No fevers.   Symptoms x4 days. Worse last night.   Using ginger shots/ mucinex.

## 2024-02-09 NOTE — Discharge Instructions (Signed)
 Take the Bromfed-DM as directed.  Follow-up with your primary care provider.  Go to the emergency department if you have worsening symptoms.

## 2024-02-09 NOTE — ED Provider Notes (Signed)
 " [facility]    CSN: [id number] Arrival date & time: 02/09/24  1233      History   Chief Complaint Chief Complaint  Patient presents with   Cough    HPI Sheri Dawson is a 53 y.o. female.  Patient presents with 4-day history of congestion, cough, wheezing, headache.  No fever, chest pain, shortness of breath.  She has been treating her symptoms with ginger until yesterday when she started Mucinex.  She reports no pertinent medical history.  The history is provided by the patient and medical records.    History reviewed. No pertinent past medical history.  There are no active problems to display for this patient.   Past Surgical History:  Procedure Laterality Date   CESAREAN SECTION     TUBAL LIGATION      OB History   No obstetric history on file.      Home Medications    Prior to Admission medications  Medication Sig Start Date End Date Taking? Authorizing Provider  brompheniramine-pseudoephedrine-DM 30-2-10 MG/5ML syrup Take 5 mLs by mouth 4 (four) times daily as needed. 02/09/24  Yes Corlis Burnard DEL, NP  estradiol (CLIMARA - DOSED IN MG/24 HR) 0.05 mg/24hr patch Place 0.05 mg onto the skin once a week.   Yes [provider]  progesterone (PROMETRIUM) 100 MG capsule Take 100 mg by mouth daily.   Yes [provider]    Family History Family History  Problem Relation Age of Onset   Cancer Maternal Grandmother     Social History Social History[1]   Allergies   Patient has no known allergies.   Review of Systems Review of Systems  Constitutional:  Negative for chills and fever.  HENT:  Positive for congestion. Negative for ear pain and sore throat.   Respiratory:  Positive for cough and wheezing. Negative for shortness of breath.   Cardiovascular:  Negative for chest pain and palpitations.     Physical Exam Triage Vital Signs ED Triage Vitals  Encounter Vitals Group     BP 02/09/24 1417 (!) 142/86     Girls Systolic BP  Percentile --      Girls Diastolic BP Percentile --      Boys Systolic BP Percentile --      Boys Diastolic BP Percentile --      Pulse Rate 02/09/24 1417 89     Resp 02/09/24 1417 18     Temp 02/09/24 1417 99.6 F (37.6 C)     Temp src --      SpO2 02/09/24 1417 98 %     Weight --      Height --      Head Circumference --      Peak Flow --      Pain Score 02/09/24 1414 4     Pain Loc --      Pain Education --      Exclude from Growth Chart --    No data found.  Updated Vital Signs BP (!) 142/86   Pulse 89   Temp 99.6 F (37.6 C)   Resp 18   LMP 01/27/2024   SpO2 98%   Visual Acuity Right Eye Distance:   Left Eye Distance:   Bilateral Distance:    Right Eye Near:   Left Eye Near:    Bilateral Near:     Physical Exam Constitutional:      General: She is not in acute distress. HENT:     Right  Ear: Tympanic membrane normal.     Left Ear: Tympanic membrane normal.     Nose: Congestion and rhinorrhea present.     Mouth/Throat:     Mouth: Mucous membranes are moist.     Pharynx: Oropharynx is clear.  Cardiovascular:     Rate and Rhythm: Normal rate and regular rhythm.     Heart sounds: Normal heart sounds.  Pulmonary:     Effort: Pulmonary effort is normal. No respiratory distress.     Breath sounds: Normal breath sounds.  Neurological:     Mental Status: She is alert.      UC Treatments / Results  Labs (all labs ordered are listed, but only abnormal results are displayed) Labs Reviewed - No data to display  EKG   Radiology No results found.  Procedures Procedures (including critical care time)  Medications Ordered in UC Medications - No data to display  Initial Impression / Assessment and Plan / UC Course  I have reviewed the triage vital signs and the nursing notes.  Pertinent labs & imaging results that were available during my care of the patient were reviewed by me and considered in my medical decision making (see chart for details).     Viral URI.  Afebrile and vital signs are stable.  Lungs are clear and O2 sat is 98% on room air.  Patient has been symptomatic for 4 days.  Discussed symptomatic treatment including Tylenol or ibuprofen as needed.  Bromfed-DM sent to pharmacy as patient reports this has worked well for her in the past.  Instructed her to follow-up with her PCP.  ED precautions given.  Education provided on viral respiratory illness.  She agrees to plan of care.  Final Clinical Impressions(s) / UC Diagnoses   Final diagnoses:  Viral URI     Discharge Instructions      Take the Bromfed-DM as directed.  Follow-up with your primary care provider.  Go to the emergency department if you have worsening symptoms.     ED Prescriptions     Medication Sig Dispense Auth. Provider   brompheniramine-pseudoephedrine-DM 30-2-10 MG/5ML syrup Take 5 mLs by mouth 4 (four) times daily as needed. 120 mL Corlis Burnard DEL, NP      PDMP not reviewed this encounter.    [1]  Social History Tobacco Use   Smoking status: Never  Vaping Use   Vaping status: Never Used  Substance Use Topics   Alcohol use: Not Currently   Drug use: Not Currently     Corlis Burnard DEL, NP 02/09/24 1507  "
